# Patient Record
Sex: Female | Born: 1985 | Hispanic: No | Marital: Married | Smoking: Never smoker
Health system: Southern US, Community
[De-identification: ages and names within clinical notes are randomized; demographics above are authoritative.]

## PROBLEM LIST (undated history)

## (undated) DIAGNOSIS — E739 Lactose intolerance, unspecified: Secondary | ICD-10-CM

## (undated) DIAGNOSIS — Q5210 Doubling of vagina, unspecified: Secondary | ICD-10-CM

## (undated) DIAGNOSIS — D649 Anemia, unspecified: Secondary | ICD-10-CM

## (undated) DIAGNOSIS — Z8639 Personal history of other endocrine, nutritional and metabolic disease: Secondary | ICD-10-CM

## (undated) DIAGNOSIS — N63 Unspecified lump in unspecified breast: Secondary | ICD-10-CM

## (undated) HISTORY — DX: Unspecified lump in unspecified breast: N63.0

## (undated) HISTORY — PX: WISDOM TOOTH EXTRACTION: SHX21

## (undated) HISTORY — PX: BREAST BIOPSY: SHX20

## (undated) HISTORY — DX: Personal history of other endocrine, nutritional and metabolic disease: Z86.39

## (undated) HISTORY — DX: Anemia, unspecified: D64.9

## (undated) HISTORY — DX: Lactose intolerance, unspecified: E73.9

## (undated) HISTORY — DX: Doubling of vagina, unspecified: Q52.10

---

## 2012-10-09 ENCOUNTER — Ambulatory Visit (INDEPENDENT_AMBULATORY_CARE_PROVIDER_SITE_OTHER): Payer: BC Managed Care – PPO | Admitting: Family Medicine

## 2012-10-09 ENCOUNTER — Encounter: Payer: Self-pay | Admitting: Family Medicine

## 2012-10-09 VITALS — BP 102/66 | HR 53 | Resp 16 | Ht 64.5 in | Wt 131.0 lb

## 2012-10-09 DIAGNOSIS — N390 Urinary tract infection, site not specified: Secondary | ICD-10-CM

## 2012-10-09 DIAGNOSIS — Z01419 Encounter for gynecological examination (general) (routine) without abnormal findings: Secondary | ICD-10-CM

## 2012-10-09 DIAGNOSIS — Z124 Encounter for screening for malignant neoplasm of cervix: Secondary | ICD-10-CM

## 2012-10-09 LAB — POCT URINALYSIS DIPSTICK
Bilirubin, UA: NEGATIVE
Ketones, UA: NEGATIVE
Protein, UA: NEGATIVE
Spec Grav, UA: 1.015

## 2012-10-09 NOTE — Patient Instructions (Addendum)
Urinary Tract Infection Urinary tract infections (UTIs) can develop anywhere along your urinary tract. Your urinary tract is your body's drainage system for removing wastes and extra water. Your urinary tract includes two kidneys, two ureters, a bladder, and a urethra. Your kidneys are a pair of bean-shaped organs. Each kidney is about the size of your fist. They are located below your ribs, one on each side of your spine. CAUSES Infections are caused by microbes, which are microscopic organisms, including fungi, viruses, and bacteria. These organisms are so small that they can only be seen through a microscope. Bacteria are the microbes that most commonly cause UTIs. SYMPTOMS  Symptoms of UTIs may vary by age and gender of the patient and by the location of the infection. Symptoms in young women typically include a frequent and intense urge to urinate and a painful, burning feeling in the bladder or urethra during urination. Older women and men are more likely to be tired, shaky, and weak and have muscle aches and abdominal pain. A fever may mean the infection is in your kidneys. Other symptoms of a kidney infection include pain in your back or sides below the ribs, nausea, and vomiting. DIAGNOSIS To diagnose a UTI, your caregiver will ask you about your symptoms. Your caregiver also will ask to provide a urine sample. The urine sample will be tested for bacteria and white blood cells. White blood cells are made by your body to help fight infection. TREATMENT  Typically, UTIs can be treated with medication. Because most UTIs are caused by a bacterial infection, they usually can be treated with the use of antibiotics. The choice of antibiotic and length of treatment depend on your symptoms and the type of bacteria causing your infection. HOME CARE INSTRUCTIONS  If you were prescribed antibiotics, take them exactly as your caregiver instructs you. Finish the medication even if you feel better after you  have only taken some of the medication.  Drink enough water and fluids to keep your urine clear or pale yellow.  Avoid caffeine, tea, and carbonated beverages. They tend to irritate your bladder.  Empty your bladder often. Avoid holding urine for long periods of time.  Empty your bladder before and after sexual intercourse.  After a bowel movement, women should cleanse from front to back. Use each tissue only once. SEEK MEDICAL CARE IF:   You have back pain.  You develop a fever.  Your symptoms do not begin to resolve within 3 days. SEEK IMMEDIATE MEDICAL CARE IF:   You have severe back pain or lower abdominal pain.  You develop chills.  You have nausea or vomiting.  You have continued burning or discomfort with urination. MAKE SURE YOU:   Understand these instructions.  Will watch your condition.  Will get help right away if you are not doing well or get worse. Document Released: 11/23/2004 Document Revised: 08/15/2011 Document Reviewed: 03/24/2011 Exit[employer] Patient Information 2014 Exit[employer], [place].  ````1`reventive Care for Adults, Female A healthy lifestyle and preventive care can promote health and wellness. Preventive health guidelines for women include the following key practices.  A routine yearly physical is a good way to check with your caregiver about your health and preventive screening. It is a chance to share any concerns and updates on your health, and to receive a thorough exam.  Visit your dentist for a routine exam and preventive care every 6 months. Brush your teeth twice a day and floss once a day. Good oral hygiene prevents tooth  decay and gum disease.  The frequency of eye exams is based on your age, health, family medical history, use of contact lenses, and other factors. Follow your caregiver's recommendations for frequency of eye exams.  Eat a healthy diet. Foods like vegetables, fruits, whole grains, low-fat dairy products, and lean protein foods  contain the nutrients you need without too many calories. Decrease your intake of foods high in solid fats, added sugars, and salt. Eat the right amount of calories for you.Get information about a proper diet from your caregiver, if necessary.  Regular physical exercise is one of the most important things you can do for your health. Most adults should get at least 150 minutes of moderate-intensity exercise (any activity that increases your heart rate and causes you to sweat) each week. In addition, most adults need muscle-strengthening exercises on 2 or more days a week.  Maintain a healthy weight. The body mass index (BMI) is a screening tool to identify possible weight problems. It provides an estimate of body fat based on height and weight. Your caregiver can help determine your BMI, and can help you achieve or maintain a healthy weight.For adults 20 years and older:  A BMI below 18.5 is considered underweight.  A BMI of 18.5 to 24.9 is normal.  A BMI of 25 to 29.9 is considered overweight.  A BMI of 30 and above is considered obese.  Maintain normal blood lipids and cholesterol levels by exercising and minimizing your intake of saturated fat. Eat a balanced diet with plenty of fruit and vegetables. Blood tests for lipids and cholesterol should begin at age 69 and be repeated every 5 years. If your lipid or cholesterol levels are high, you are over 50, or you are at high risk for heart disease, you may need your cholesterol levels checked more frequently.Ongoing high lipid and cholesterol levels should be treated with medicines if diet and exercise are not effective.  If you smoke, find out from your caregiver how to quit. If you do not use tobacco, do not start.  If you are pregnant, do not drink alcohol. If you are breastfeeding, be very cautious about drinking alcohol. If you are not pregnant and choose to drink alcohol, do not exceed 1 drink per day. One drink is considered to be 12  ounces (355 mL) of beer, 5 ounces (148 mL) of wine, or 1.5 ounces (44 mL) of liquor.  Avoid use of street drugs. Do not share needles with anyone. Ask for help if you need support or instructions about stopping the use of drugs.  High blood pressure causes heart disease and increases the risk of stroke. Your blood pressure should be checked at least every 1 to 2 years. Ongoing high blood pressure should be treated with medicines if weight loss and exercise are not effective.  If you are 74 to 26 years old, ask your caregiver if you should take aspirin to prevent strokes.  Diabetes screening involves taking a blood sample to check your fasting blood sugar level. This should be done once every 3 years, after age 43, if you are within normal weight and without risk factors for diabetes. Testing should be considered at a younger age or be carried out more frequently if you are overweight and have at least 1 risk factor for diabetes.  Breast cancer screening is essential preventive care for women. You should practice "breast self-awareness." This means understanding the normal appearance and feel of your breasts and may include breast self-examination.  Any changes detected, no matter how small, should be reported to a caregiver. Women in their 53s and 30s should have a clinical breast exam (CBE) by a caregiver as part of a regular health exam every 1 to 3 years. After age 65, women should have a CBE every year. Starting at age 52, women should consider having a mammography (breast X-ray test) every year. Women who have a family history of breast cancer should talk to their caregiver about genetic screening. Women at a high risk of breast cancer should talk to their caregivers about having magnetic resonance imaging (MRI) and a mammography every year.  The Pap test is a screening test for cervical cancer. A Pap test can show cell changes on the cervix that might become cervical cancer if left untreated. A Pap  test is a procedure in which cells are obtained and examined from the lower end of the uterus (cervix).  Women should have a Pap test starting at age 70.  Between ages 65 and 32, Pap tests should be repeated every 2 years.  Beginning at age 30, you should have a Pap test every 3 years as long as the past 3 Pap tests have been normal.  Some women have medical problems that increase the chance of getting cervical cancer. Talk to your caregiver about these problems. It is especially important to talk to your caregiver if a new problem develops soon after your last Pap test. In these cases, your caregiver may recommend more frequent screening and Pap tests.  The above recommendations are the same for women who have or have not gotten the vaccine for human papillomavirus (HPV).  If you had a hysterectomy for a problem that was not cancer or a condition that could lead to cancer, then you no longer need Pap tests. Even if you no longer need a Pap test, a regular exam is a good idea to make sure no other problems are starting.  If you are between ages 9 and 8, and you have had normal Pap tests going back 10 years, you no longer need Pap tests. Even if you no longer need a Pap test, a regular exam is a good idea to make sure no other problems are starting.  If you have had past treatment for cervical cancer or a condition that could lead to cancer, you need Pap tests and screening for cancer for at least 20 years after your treatment.  If Pap tests have been discontinued, risk factors (such as a new sexual partner) need to be reassessed to determine if screening should be resumed.  The HPV test is an additional test that may be used for cervical cancer screening. The HPV test looks for the virus that can cause the cell changes on the cervix. The cells collected during the Pap test can be tested for HPV. The HPV test could be used to screen women aged 49 years and older, and should be used in women of  any age who have unclear Pap test results. After the age of 63, women should have HPV testing at the same frequency as a Pap test.  Colorectal cancer can be detected and often prevented. Most routine colorectal cancer screening begins at the age of 47 and continues through age 75. However, your caregiver may recommend screening at an earlier age if you have risk factors for colon cancer. On a yearly basis, your caregiver may provide home test kits to check for hidden blood in the stool. Use  of a small camera at the end of a tube, to directly examine the colon (sigmoidoscopy or colonoscopy), can detect the earliest forms of colorectal cancer. Talk to your caregiver about this at age 3, when routine screening begins. Direct examination of the colon should be repeated every 5 to 10 years through age 35, unless early forms of pre-cancerous polyps or small growths are found.  Hepatitis C blood testing is recommended for all people born from [birth year] through 1965 and any individual with known risks for hepatitis C.  Practice safe sex. Use condoms and avoid high-risk sexual practices to reduce the spread of sexually transmitted infections (STIs). STIs include gonorrhea, chlamydia, syphilis, trichomonas, herpes, HPV, and human immunodeficiency virus (HIV). Herpes, HIV, and HPV are viral illnesses that have no cure. They can result in disability, cancer, and death. Sexually active women aged 1 and younger should be checked for chlamydia. Older women with new or multiple partners should also be tested for chlamydia. Testing for other STIs is recommended if you are sexually active and at increased risk.  Osteoporosis is a disease in which the bones lose minerals and strength with aging. This can result in serious bone fractures. The risk of osteoporosis can be identified using a bone density scan. Women ages 100 and over and women at risk for fractures or osteoporosis should discuss screening with their caregivers. Ask  your caregiver whether you should take a calcium supplement or vitamin D to reduce the rate of osteoporosis.  Menopause can be associated with physical symptoms and risks. Hormone replacement therapy is available to decrease symptoms and risks. You should talk to your caregiver about whether hormone replacement therapy is right for you.  Use sunscreen with sun protection factor (SPF) of 30 or more. Apply sunscreen liberally and repeatedly throughout the day. You should seek shade when your shadow is shorter than you. Protect yourself by wearing long sleeves, pants, a wide-brimmed hat, and sunglasses year round, whenever you are outdoors.  Once a month, do a whole body skin exam, using a mirror to look at the skin on your back. Notify your caregiver of new moles, moles that have irregular borders, moles that are larger than a pencil eraser, or moles that have changed in shape or color.  Stay current with required immunizations.  Influenza. You need a dose every fall (or winter). The composition of the flu vaccine changes each year, so being vaccinated once is not enough.  Pneumococcal polysaccharide. You need 1 to 2 doses if you smoke cigarettes or if you have certain chronic medical conditions. You need 1 dose at age 34 (or older) if you have never been vaccinated.  Tetanus, diphtheria, pertussis (Tdap, Td). Get 1 dose of Tdap vaccine if you are younger than age 51, are over 60 and have contact with an infant, are a Research scientist (physical sciences), are pregnant, or simply want to be protected from whooping cough. After that, you need a Td booster dose every 10 years. Consult your caregiver if you have not had at least 3 tetanus and diphtheria-containing shots sometime in your life or have a deep or dirty wound.  HPV. You need this vaccine if you are a woman age 48 or younger. The vaccine is given in 3 doses over 6 months.  Measles, mumps, rubella (MMR). You need at least 1 dose of MMR if you were born in 1957 or  later. You may also need a second dose.  Meningococcal. If you are age 27 to 31 and  a Orthoptist living in a residence hall, or have one of several medical conditions, you need to get vaccinated against meningococcal disease. You may also need additional booster doses.  Zoster (shingles). If you are age 67 or older, you should get this vaccine.  Varicella (chickenpox). If you have never had chickenpox or you were vaccinated but received only 1 dose, talk to your caregiver to find out if you need this vaccine.  Hepatitis A. You need this vaccine if you have a specific risk factor for hepatitis A virus infection or you simply wish to be protected from this disease. The vaccine is usually given as 2 doses, 6 to 18 months apart.  Hepatitis B. You need this vaccine if you have a specific risk factor for hepatitis B virus infection or you simply wish to be protected from this disease. The vaccine is given in 3 doses, usually over 6 months. Preventive Services / Frequency Ages 79 to 29  Blood pressure check.** / Every 1 to 2 years.  Lipid and cholesterol check.** / Every 5 years beginning at age 38.  Clinical breast exam.** / Every 3 years for women in their 24s and 30s.  Pap test.** / Every 2 years from ages 26 through 73. Every 3 years starting at age 2 through age 55 or 68 with a history of 3 consecutive normal Pap tests.  HPV screening.** / Every 3 years from ages 31 through ages 85 to 62 with a history of 3 consecutive normal Pap tests.  Hepatitis C blood test.** / For any individual with known risks for hepatitis C.  Skin self-exam. / Monthly.  Influenza immunization.** / Every year.  Pneumococcal polysaccharide immunization.** / 1 to 2 doses if you smoke cigarettes or if you have certain chronic medical conditions.  Tetanus, diphtheria, pertussis (Tdap, Td) immunization. / A one-time dose of Tdap vaccine. After that, you need a Td booster dose every 10 years.  HPV  immunization. / 3 doses over 6 months, if you are 42 and younger.  Measles, mumps, rubella (MMR) immunization. / You need at least 1 dose of MMR if you were born in 1957 or later. You may also need a second dose.  Meningococcal immunization. / 1 dose if you are age 28 to 67 and a first-year college student living in a residence hall, or have one of several medical conditions, you need to get vaccinated against meningococcal disease. You may also need additional booster doses.  Varicella immunization.** / Consult your caregiver.  Hepatitis A immunization.** / Consult your caregiver. 2 doses, 6 to 18 months apart.  Hepatitis B immunization.** / Consult your caregiver. 3 doses usually over 6 months. Ages 69 to 83  Blood pressure check.** / Every 1 to 2 years.  Lipid and cholesterol check.** / Every 5 years beginning at age 73.  Clinical breast exam.** / Every year after age 16.  Mammogram.** / Every year beginning at age 27 and continuing for as long as you are in good health. Consult with your caregiver.  Pap test.** / Every 3 years starting at age 63 through age 84 or 86 with a history of 3 consecutive normal Pap tests.  HPV screening.** / Every 3 years from ages 48 through ages 16 to 41 with a history of 3 consecutive normal Pap tests.  Fecal occult blood test (FOBT) of stool. / Every year beginning at age 74 and continuing until age 31. You may not need to do this test if you  get a colonoscopy every 10 years.  Flexible sigmoidoscopy or colonoscopy.** / Every 5 years for a flexible sigmoidoscopy or every 10 years for a colonoscopy beginning at age 62 and continuing until age 73.  Hepatitis C blood test.** / For all people born from [birth year] through 1964 and any individual with known risks for hepatitis C.  Skin self-exam. / Monthly.  Influenza immunization.** / Every year.  Pneumococcal polysaccharide immunization.** / 1 to 2 doses if you smoke cigarettes or if you have certain chronic  medical conditions.  Tetanus, diphtheria, pertussis (Tdap, Td) immunization.** / A one-time dose of Tdap vaccine. After that, you need a Td booster dose every 10 years.  Measles, mumps, rubella (MMR) immunization. / You need at least 1 dose of MMR if you were born in 1957 or later. You may also need a second dose.  Varicella immunization.** / Consult your caregiver.  Meningococcal immunization.** / Consult your caregiver.  Hepatitis A immunization.** / Consult your caregiver. 2 doses, 6 to 18 months apart.  Hepatitis B immunization.** / Consult your caregiver. 3 doses, usually over 6 months. Ages 4 and over  Blood pressure check.** / Every 1 to 2 years.  Lipid and cholesterol check.** / Every 5 years beginning at age 31.  Clinical breast exam.** / Every year after age 72.  Mammogram.** / Every year beginning at age 42 and continuing for as long as you are in good health. Consult with your caregiver.  Pap test.** / Every 3 years starting at age 54 through age 70 or 62 with a 3 consecutive normal Pap tests. Testing can be stopped between 65 and 70 with 3 consecutive normal Pap tests and no abnormal Pap or HPV tests in the past 10 years.  HPV screening.** / Every 3 years from ages 46 through ages 77 or 62 with a history of 3 consecutive normal Pap tests. Testing can be stopped between 65 and 70 with 3 consecutive normal Pap tests and no abnormal Pap or HPV tests in the past 10 years.  Fecal occult blood test (FOBT) of stool. / Every year beginning at age 58 and continuing until age 61. You may not need to do this test if you get a colonoscopy every 10 years.  Flexible sigmoidoscopy or colonoscopy.** / Every 5 years for a flexible sigmoidoscopy or every 10 years for a colonoscopy beginning at age 72 and continuing until age 30.  Hepatitis C blood test.** / For all people born from [birth year] through 1964 and any individual with known risks for hepatitis C.  Osteoporosis screening.** / A  one-time screening for women ages 45 and over and women at risk for fractures or osteoporosis.  Skin self-exam. / Monthly.  Influenza immunization.** / Every year.  Pneumococcal polysaccharide immunization.** / 1 dose at age 66 (or older) if you have never been vaccinated.  Tetanus, diphtheria, pertussis (Tdap, Td) immunization. / A one-time dose of Tdap vaccine if you are over 65 and have contact with an infant, are a Research scientist (physical sciences), or simply want to be protected from whooping cough. After that, you need a Td booster dose every 10 years.  Varicella immunization.** / Consult your caregiver.  Meningococcal immunization.** / Consult your caregiver.  Hepatitis A immunization.** / Consult your caregiver. 2 doses, 6 to 18 months apart.  Hepatitis B immunization.** / Check with your caregiver. 3 doses, usually over 6 months. ** Family history and personal history of risk and conditions may change your caregiver's recommendations. Document Released: 04/11/2001 Document  Revised: 05/08/2011 Document Reviewed: 07/11/2010 Exit[employer] Patient Information 2014 [place], [place].

## 2012-10-09 NOTE — Progress Notes (Signed)
  Subjective:     Joann Harper is a 27 y.o. female and is here for a comprehensive physical exam. The patient reports no problems. Has h/o vaginal septum, and 2 cervices.  Has not had renal system imaging, but knows she needs it. Feels like she has a urinary tract infection, because she has anorexia and abdominal pain.  Denies dysuria.  History   Social History  . Marital Status: Married    Spouse Name: N/A    Number of Children: N/A  . Years of Education: N/A   Occupational History  . student    Social History Main Topics  . Smoking status: Never Smoker   . Smokeless tobacco: Never Used  . Alcohol Use: Yes     Comment: occasionallly  . Drug Use: No  . Sexual Activity: Yes    Partners: Male   Other Topics Concern  . Not on file   Social History Narrative  . No narrative on file   No health maintenance topics applied.  The following portions of the patient's history were reviewed and updated as appropriate: allergies, current medications, past family history, past medical history, past social history, past surgical history and problem list.  Review of Systems A comprehensive review of systems was negative.   Objective:    BP 102/66  Pulse 53  Resp 16  Ht 5' 4.5" (1.638 m)  Wt 131 lb (59.421 kg)  BMI 22.15 kg/m2  LMP 09/18/2012 General appearance: alert, cooperative and appears stated age Head: Normocephalic, without obvious abnormality, atraumatic Neck: no adenopathy, supple, symmetrical, trachea midline and thyroid not enlarged, symmetric, no tenderness/mass/nodules Lungs: clear to auscultation bilaterally Breasts: normal appearance, no masses or tenderness Heart: regular rate and rhythm, S1, S2 normal, no murmur, click, rub or gallop Abdomen: soft, non-tender; bowel sounds normal; no masses,  no organomegaly Pelvic: external genitalia normal, no adnexal masses or tenderness, uterus normal size, shape, and consistency and patient has partial vaginal septum, 2  cervices noted both appear normal, 2 uteri are also probably present, but it is difficult to distinguish on bimanual exam. Extremities: extremities normal, atraumatic, no cyanosis or edema Pulses: 2+ and symmetric Skin: Skin color, texture, turgor normal. No rashes or lesions Lymph nodes: Cervical, supraclavicular, and axillary nodes normal. Neurologic: Grossly normal    U/A is essentially negative--tr blood.   Assessment:    Healthy female exam. Uterine Didelphus      Plan:  Pap smear x 2 obtained.   Will check culture. See After Visit Summary for Counseling Recommendations

## 2012-10-10 ENCOUNTER — Telehealth: Payer: Self-pay | Admitting: *Deleted

## 2012-10-10 LAB — COMPREHENSIVE METABOLIC PANEL
Albumin: 4.2 g/dL (ref 3.5–5.2)
BUN: 10 mg/dL (ref 6–23)
CO2: 26 mEq/L (ref 19–32)
Calcium: 8.6 mg/dL (ref 8.4–10.5)
Chloride: 103 mEq/L (ref 96–112)
Creat: 0.63 mg/dL (ref 0.50–1.10)
Glucose, Bld: 43 mg/dL — CL (ref 70–99)
Potassium: 4.2 mEq/L (ref 3.5–5.3)

## 2012-10-10 LAB — LIPID PANEL
Cholesterol: 88 mg/dL (ref 0–200)
HDL: 40 mg/dL (ref >39)
Triglycerides: 41 mg/dL (ref <150)

## 2012-10-10 LAB — CBC
HCT: 38.5 % (ref 36.0–46.0)
Hemoglobin: 13.1 g/dL (ref 12.0–15.0)
MCV: 88.3 fL (ref 78.0–100.0)
RBC: 4.36 MIL/uL (ref 3.87–5.11)
RDW: 13.4 % (ref 11.5–15.5)
WBC: 4.8 10*3/uL (ref 4.0–10.5)

## 2012-10-10 LAB — TSH: TSH: 1.493 u[IU]/mL (ref 0.350–4.500)

## 2012-10-10 NOTE — Telephone Encounter (Signed)
Copy of labs mailed to pt's home address. 

## 2012-10-10 NOTE — Progress Notes (Signed)
Copy of labs mailed to pt's home address. 

## 2012-10-14 LAB — CULTURE, URINE COMPREHENSIVE

## 2012-10-17 ENCOUNTER — Telehealth: Payer: Self-pay | Admitting: *Deleted

## 2012-10-17 DIAGNOSIS — N390 Urinary tract infection, site not specified: Secondary | ICD-10-CM

## 2012-10-17 MED ORDER — AMPICILLIN 500 MG PO CAPS: 500.0000 mg | ORAL_CAPSULE | Freq: Three times a day (TID) | ORAL | Status: DC

## 2012-10-17 NOTE — Telephone Encounter (Signed)
Called pt to adv meds for UTI has been sent to pharm - Pt is at school but will have someone pick meds up for her

## 2013-01-02 ENCOUNTER — Other Ambulatory Visit: Payer: Self-pay

## 2013-10-17 ENCOUNTER — Ambulatory Visit: Payer: BC Managed Care – PPO

## 2013-12-09 ENCOUNTER — Ambulatory Visit (INDEPENDENT_AMBULATORY_CARE_PROVIDER_SITE_OTHER): Payer: BC Managed Care – PPO | Admitting: Obstetrics & Gynecology

## 2013-12-09 ENCOUNTER — Other Ambulatory Visit: Payer: Self-pay | Admitting: Obstetrics & Gynecology

## 2013-12-09 ENCOUNTER — Encounter: Payer: Self-pay | Admitting: Obstetrics & Gynecology

## 2013-12-09 VITALS — BP 117/71 | HR 73 | Resp 16 | Ht 64.5 in | Wt 134.0 lb

## 2013-12-09 DIAGNOSIS — N63 Unspecified lump in breast: Secondary | ICD-10-CM

## 2013-12-09 DIAGNOSIS — Q512 Other doubling of uterus, unspecified: Secondary | ICD-10-CM

## 2013-12-09 DIAGNOSIS — Z01419 Encounter for gynecological examination (general) (routine) without abnormal findings: Secondary | ICD-10-CM | POA: Diagnosis not present

## 2013-12-09 DIAGNOSIS — R102 Pelvic and perineal pain: Secondary | ICD-10-CM

## 2013-12-09 DIAGNOSIS — N6320 Unspecified lump in the left breast, unspecified quadrant: Secondary | ICD-10-CM

## 2013-12-09 DIAGNOSIS — Q5128 Other doubling of uterus, other specified: Secondary | ICD-10-CM

## 2013-12-10 DIAGNOSIS — N6320 Unspecified lump in the left breast, unspecified quadrant: Secondary | ICD-10-CM | POA: Insufficient documentation

## 2013-12-10 LAB — GC/CHLAMYDIA PROBE AMP
CT PROBE, AMP APTIMA: NEGATIVE
GC Probe RNA: NEGATIVE

## 2013-12-10 LAB — WET PREP BY MOLECULAR PROBE
Candida species: POSITIVE — AB
GARDNERELLA VAGINALIS: NEGATIVE
Trichomonas vaginosis: NEGATIVE

## 2013-12-10 MED ORDER — FLUCONAZOLE 150 MG PO TABS: 150.0000 mg | ORAL_TABLET | Freq: Once | ORAL | Status: DC

## 2013-12-10 NOTE — Progress Notes (Signed)
  Subjective:     Weldon InchesLorraine Winnick is a 28 y.o. female here for a routine exam.  Current complaints: diarrhea, urinary frequency adn pain, vaginal discharge, pain with intercourse.  Personal health questionnaire reviewed: yes.   Gynecologic History Pt has uterine didelphyis with 2 cervix (per Dr. Tawni LevyPratt's note) Patient's last menstrual period was 10/11/2013. Contraception: condoms Last Pap: 2014. Results were: normal Last mammogram: n/a.    Obstetric History OB History  Gravida Para Term Preterm AB SAB TAB Ectopic Multiple Living  0 0 0 0 0 0 0 0 0 0          The following portions of the patient's history were reviewed and updated as appropriate: allergies, current medications, past family history, past medical history, past social history, past surgical history and problem list.  Review of Systems Pertinent items are noted in HPI.    Objective:      Filed Vitals:   12/09/13 1425  BP: 117/71  Pulse: 73  Resp: 16  Height: 5' 4.5" (1.638 m)  Weight: 134 lb (60.782 kg)   Vitals:  WNL General appearance: alert, cooperative and no distress Head: Normocephalic, without obvious abnormality, atraumatic Eyes: negative Throat: lips, mucosa, and tongue normal; teeth and gums normal Lungs: clear to auscultation bilaterally Breasts: normal appearance,No nipple retraction or dimpling, No nipple discharge or bleeding; there is a mass behind the left nipple.  Pt had [place] biopsy several years ago in [place] which was benign.  Would recommend further imagine here. Heart: regular rate and rhythm Abdomen: soft, non-tender; bowel sounds normal; no masses,  no organomegaly  Pelvic:  External Genitalia:  Tanner V, no lesion Urethra:  No pp Vagina:  Pink, normal rugae, no blood or discharge, no septum seen Cervix:  No CMT, no lesion (I ONLY SEE AND FEEL ONE CERVIX.  THE OTHER IS APPARENTLY VERY SMALL.  DUE TO PATIENT PAIN WITH EXAM, WILL STOP AND ORDER US) Uterus:  Normal size and contour, non  tender Adnexa:  Normal, no masses, non tender  Extremities: no edema, redness or tenderness in the calves or thighs Skin: no lesions or rash Lymph nodes: Axillary adenopathy: none        Assessment:    Healthy female exam.  Uterine didelphys on pt report (has never had an US or imagine of her kidneys)   Plan:    Education reviewed: self breast exams. Contraception: condoms. Follow up in: 2 weeks. Pelvic US and TV US Need to image urinary system Needs PCP to address chronic diarrhea UA and U culture today.   Encourage more reliable birth control at next visit.

## 2013-12-11 ENCOUNTER — Other Ambulatory Visit: Payer: Self-pay | Admitting: Obstetrics & Gynecology

## 2013-12-11 DIAGNOSIS — Q512 Other doubling of uterus, unspecified: Principal | ICD-10-CM

## 2013-12-11 DIAGNOSIS — Q5128 Other doubling of uterus, other specified: Secondary | ICD-10-CM

## 2013-12-15 ENCOUNTER — Telehealth: Payer: Self-pay | Admitting: *Deleted

## 2013-12-15 DIAGNOSIS — Q5128 Other doubling of uterus, other specified: Secondary | ICD-10-CM

## 2013-12-15 DIAGNOSIS — Q512 Other doubling of uterus, unspecified: Principal | ICD-10-CM

## 2013-12-15 NOTE — Telephone Encounter (Signed)
LMOM to let pt know Imaging in [place] will be calling to schedule MRI - Pelvis and that U/S Pelvis has been canceled.

## 2013-12-15 NOTE — Telephone Encounter (Signed)
Error

## 2013-12-15 NOTE — Addendum Note (Signed)
Addended by: Arne ClevelandHUTCHINSON, MANDY J on: 12/15/2013 01:33 PM   Modules accepted: Orders

## 2013-12-22 ENCOUNTER — Ambulatory Visit (INDEPENDENT_AMBULATORY_CARE_PROVIDER_SITE_OTHER): Payer: BC Managed Care – PPO

## 2013-12-22 DIAGNOSIS — N8329 Other ovarian cysts: Secondary | ICD-10-CM

## 2013-12-22 DIAGNOSIS — Q5128 Other doubling of uterus, other specified: Secondary | ICD-10-CM

## 2013-12-22 DIAGNOSIS — Q512 Other doubling of uterus, unspecified: Principal | ICD-10-CM

## 2013-12-23 ENCOUNTER — Encounter: Payer: Self-pay | Admitting: Obstetrics & Gynecology

## 2013-12-25 ENCOUNTER — Ambulatory Visit (HOSPITAL_COMMUNITY): Payer: BC Managed Care – PPO

## 2014-01-01 ENCOUNTER — Encounter: Payer: Self-pay | Admitting: Obstetrics & Gynecology

## 2014-01-01 ENCOUNTER — Encounter: Payer: Self-pay | Admitting: *Deleted

## 2014-01-01 ENCOUNTER — Ambulatory Visit (INDEPENDENT_AMBULATORY_CARE_PROVIDER_SITE_OTHER): Payer: BC Managed Care – PPO | Admitting: Obstetrics & Gynecology

## 2014-01-01 VITALS — BP 108/56 | HR 72 | Resp 16 | Ht 64.5 in | Wt 134.0 lb

## 2014-01-01 DIAGNOSIS — B373 Candidiasis of vulva and vagina: Secondary | ICD-10-CM

## 2014-01-01 DIAGNOSIS — N8320 Unspecified ovarian cysts: Secondary | ICD-10-CM

## 2014-01-01 DIAGNOSIS — B3731 Acute candidiasis of vulva and vagina: Secondary | ICD-10-CM

## 2014-01-01 DIAGNOSIS — Q512 Other doubling of uterus: Secondary | ICD-10-CM | POA: Diagnosis not present

## 2014-01-01 MED ORDER — FLUCONAZOLE 150 MG PO TABS: ORAL_TABLET | ORAL | Status: DC

## 2014-01-01 NOTE — Progress Notes (Signed)
ppatient presents today for follow-up of test results. Patient was noted to have candidate on her wet prep and Diflucan was prescribed today. Patient had MRI of her pelvis which showed  There is a soft tissue septum which extends through the central aspect of the uterus all the way to the level of the cervical os. 2 separate endocervical canals and endometrial canals are noted. However, the fundal portion of the uterus appears united by intervening soft tissue. No focal uterine lesions. No focal endometrial lesions. Bilateral ovaries are normal in size and appearance with multiple small follicles measuring 2.4 x 2.7 x 2.6 cm on the right and 2.7 x 1.3 x 3.0 cm on the left. There is also a small 1.6 x 1.6 x 1.6 cm lesion in the in the anterior aspect of the left hemipelvis, which is low T1 signal intensity, high T2 signal intensity, presumably a small paraovarian cyst.  IMPRESSION: 1. Complete septate uterus. 2. 1.6 cm left paraovarian cyst incidentally noted.  Urinary system was not visualized as ordered.  Explained patient's anatomy and detail injury pictures. The patient seemed to understand. Asian does not wish to change her birth control from condoms. Patient knows that she can call us and have this changed easily. Patient not having any urinary tract symptoms currently. However we did review good vulvar hygiene and wiping front to back after using the bathroom.  We'll call radiology and have them reviewed the films to look at her urinary system.  25 minutes spent with patient face-to-face; there is an 50% was counseling.

## 2014-01-06 ENCOUNTER — Ambulatory Visit
Admission: RE | Admit: 2014-01-06 | Discharge: 2014-01-06 | Disposition: A | Discharge status: Home / Self Care | Payer: BC Managed Care – PPO | Source: Ambulatory Visit | Attending: Obstetrics & Gynecology | Admitting: Obstetrics & Gynecology

## 2014-01-06 DIAGNOSIS — N6320 Unspecified lump in the left breast, unspecified quadrant: Secondary | ICD-10-CM

## 2015-11-09 ENCOUNTER — Emergency Department (HOSPITAL_BASED_OUTPATIENT_CLINIC_OR_DEPARTMENT_OTHER): Payer: BLUE CROSS/BLUE SHIELD

## 2015-11-09 ENCOUNTER — Emergency Department (HOSPITAL_BASED_OUTPATIENT_CLINIC_OR_DEPARTMENT_OTHER)
Admission: EM | Admit: 2015-11-09 | Discharge: 2015-11-09 | Discharge status: Against Medical Advice | Payer: BLUE CROSS/BLUE SHIELD | Attending: Emergency Medicine | Admitting: Emergency Medicine

## 2015-11-09 ENCOUNTER — Encounter (HOSPITAL_BASED_OUTPATIENT_CLINIC_OR_DEPARTMENT_OTHER): Payer: Self-pay

## 2015-11-09 DIAGNOSIS — B9789 Other viral agents as the cause of diseases classified elsewhere: Secondary | ICD-10-CM

## 2015-11-09 DIAGNOSIS — E876 Hypokalemia: Secondary | ICD-10-CM | POA: Diagnosis not present

## 2015-11-09 DIAGNOSIS — Z792 Long term (current) use of antibiotics: Secondary | ICD-10-CM | POA: Diagnosis not present

## 2015-11-09 DIAGNOSIS — Z7951 Long term (current) use of inhaled steroids: Secondary | ICD-10-CM | POA: Insufficient documentation

## 2015-11-09 DIAGNOSIS — R0602 Shortness of breath: Secondary | ICD-10-CM | POA: Diagnosis present

## 2015-11-09 DIAGNOSIS — J069 Acute upper respiratory infection, unspecified: Secondary | ICD-10-CM | POA: Insufficient documentation

## 2015-11-09 DIAGNOSIS — J988 Other specified respiratory disorders: Secondary | ICD-10-CM

## 2015-11-09 LAB — BASIC METABOLIC PANEL
Anion gap: 8 (ref 5–15)
BUN: <5 mg/dL — ABNORMAL LOW (ref 6–20)
CHLORIDE: 101 mmol/L (ref 101–111)
CO2: 28 mmol/L (ref 22–32)
Calcium: 8.9 mg/dL (ref 8.9–10.3)
Creatinine, Ser: 0.62 mg/dL (ref 0.44–1.00)
GFR calc non Af Amer: >60 mL/min (ref >60)
Glucose, Bld: 120 mg/dL — ABNORMAL HIGH (ref 65–99)
POTASSIUM: 2.3 mmol/L — AB (ref 3.5–5.1)
SODIUM: 137 mmol/L (ref 135–145)

## 2015-11-09 LAB — URINALYSIS, ROUTINE W REFLEX MICROSCOPIC
Bilirubin Urine: NEGATIVE
Glucose, UA: NEGATIVE mg/dL
Hgb urine dipstick: NEGATIVE
Ketones, ur: NEGATIVE mg/dL
NITRITE: NEGATIVE
PH: 6 (ref 5.0–8.0)
Protein, ur: NEGATIVE mg/dL
SPECIFIC GRAVITY, URINE: 1.028 (ref 1.005–1.030)

## 2015-11-09 LAB — CBC WITH DIFFERENTIAL/PLATELET
BASOS ABS: 0 10*3/uL (ref 0.0–0.1)
Basophils Relative: 0 %
EOS PCT: 2 %
Eosinophils Absolute: 0.1 10*3/uL (ref 0.0–0.7)
HEMATOCRIT: 39.6 % (ref 36.0–46.0)
HEMOGLOBIN: 13.5 g/dL (ref 12.0–15.0)
LYMPHS ABS: 2.8 10*3/uL (ref 0.7–4.0)
LYMPHS PCT: 43 %
MCH: 30.1 pg (ref 26.0–34.0)
MCHC: 34.1 g/dL (ref 30.0–36.0)
MCV: 88.4 fL (ref 78.0–100.0)
MONO ABS: 0.7 10*3/uL (ref 0.1–1.0)
Monocytes Relative: 10 %
NEUTROS ABS: 3 10*3/uL (ref 1.7–7.7)
Neutrophils Relative %: 45 %
Platelets: 456 10*3/uL — ABNORMAL HIGH (ref 150–400)
RBC: 4.48 MIL/uL (ref 3.87–5.11)
RDW: 11.7 % (ref 11.5–15.5)
WBC: 6.6 10*3/uL (ref 4.0–10.5)

## 2015-11-09 LAB — PREGNANCY, URINE: PREG TEST UR: NEGATIVE

## 2015-11-09 LAB — URINE MICROSCOPIC-ADD ON

## 2015-11-09 LAB — PATHOLOGIST SMEAR REVIEW

## 2015-11-09 IMAGING — CR DG CHEST 2V
2 series · 2 of 2 positions shown · non-contrast
Comparison: None.

CLINICAL DATA: Cough and dyspnea

EXAM:
CHEST  2 VIEW

[w chest pa]
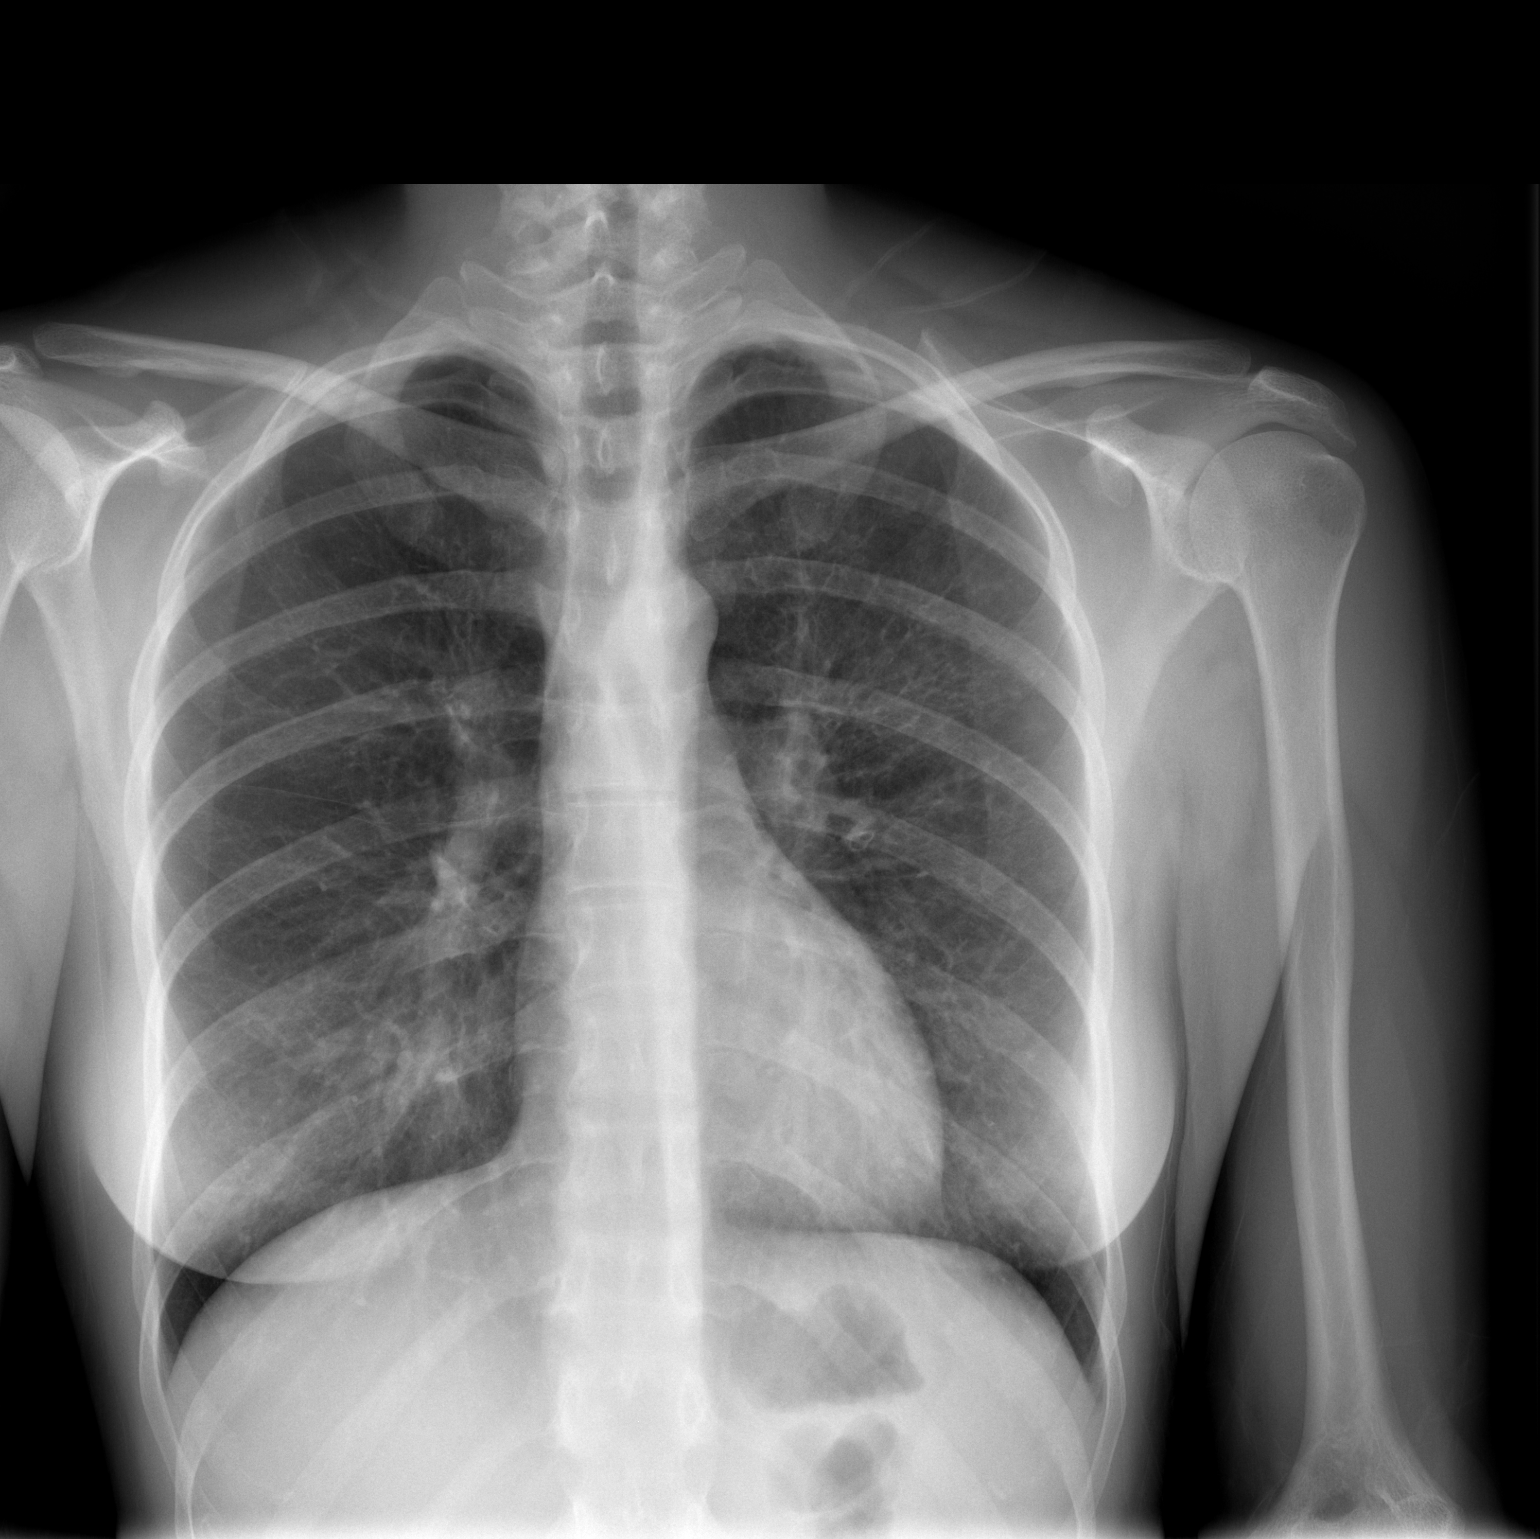

[w chest lat]
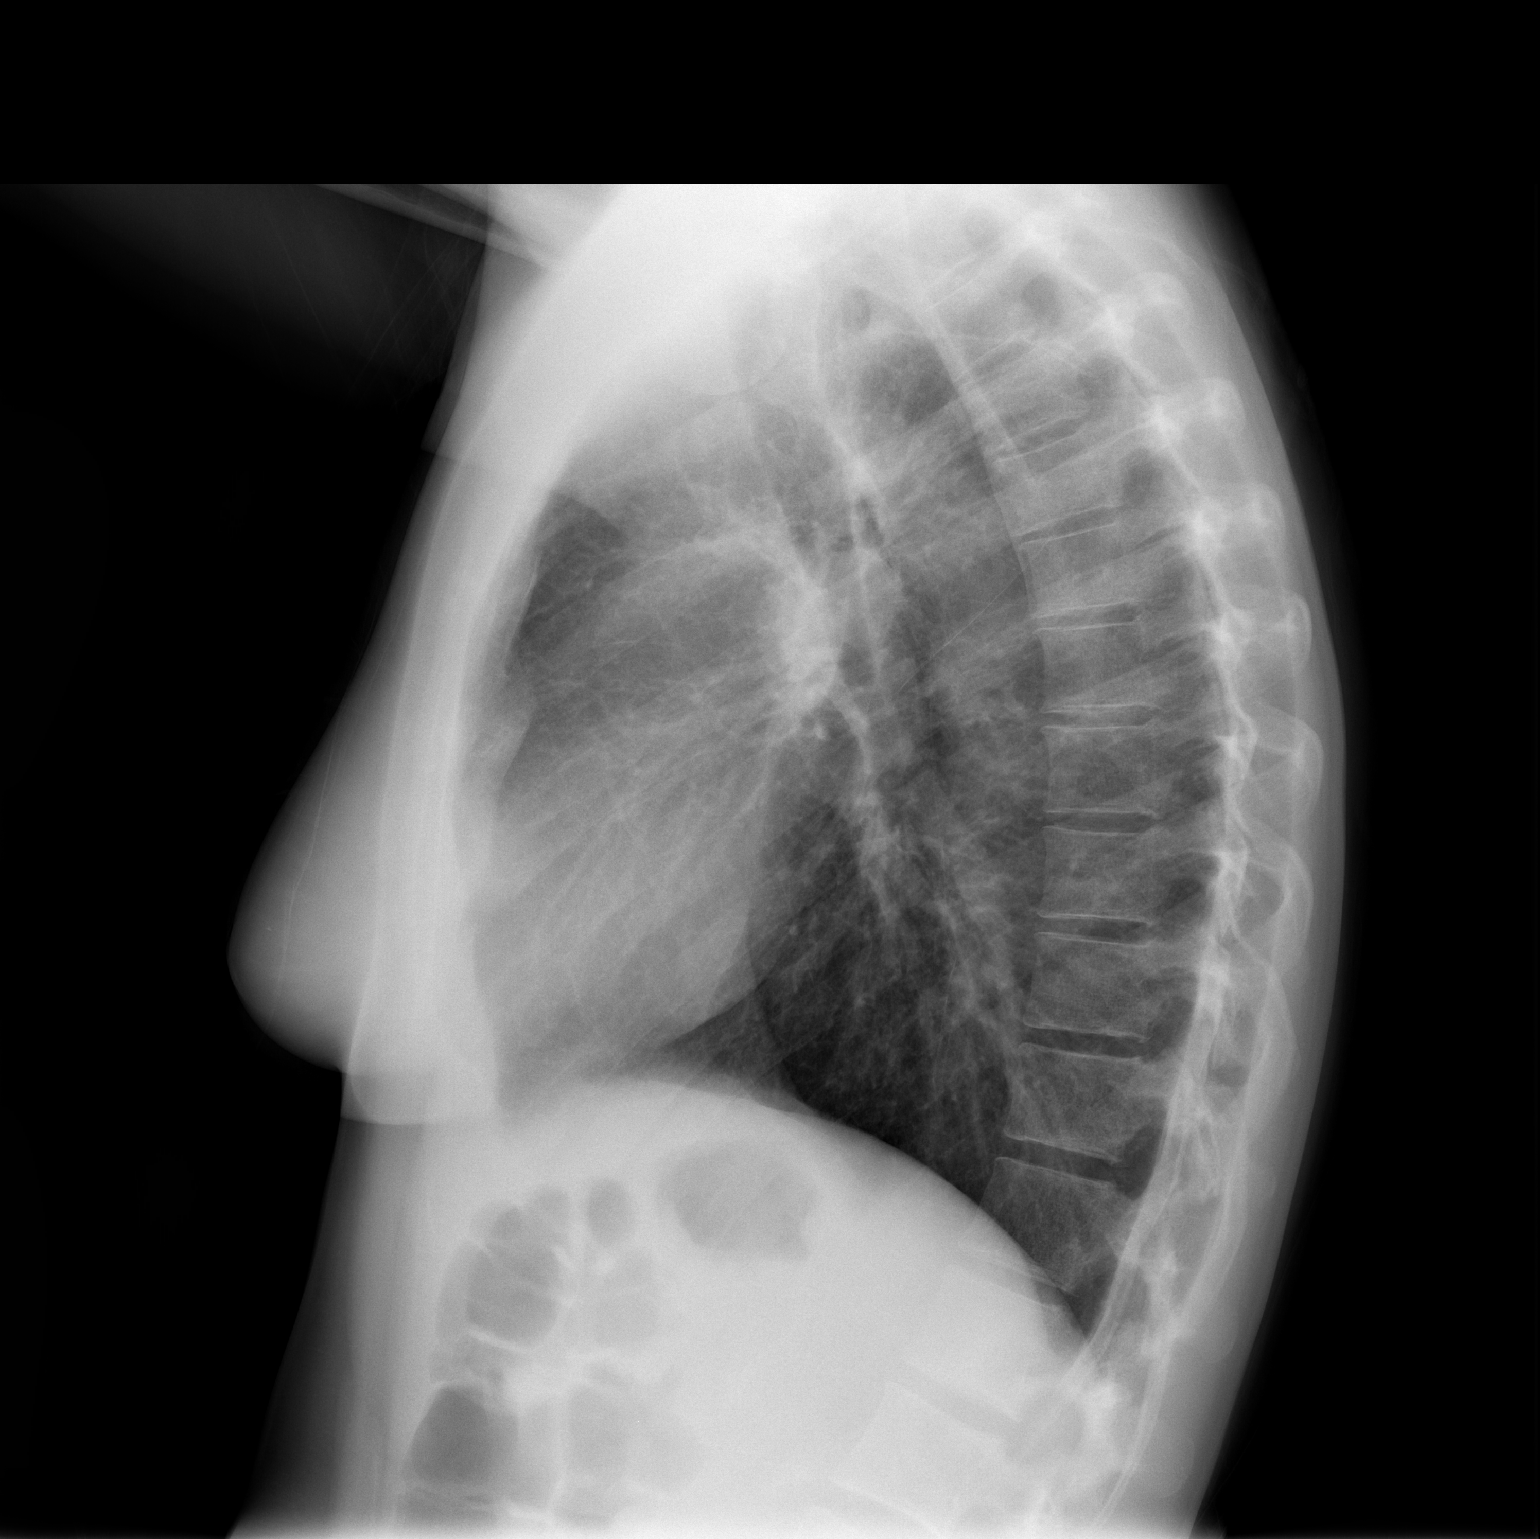

[2 of 2 positions shown; findings below may reference images not displayed]

FINDINGS: The heart size and mediastinal contours are within normal limits.
Both lungs are clear. The visualized skeletal structures are
unremarkable.
IMPRESSION: No active cardiopulmonary disease.

## 2015-11-09 MED ORDER — POTASSIUM CHLORIDE CRYS ER 20 MEQ PO TBCR
40.0000 meq | EXTENDED_RELEASE_TABLET | Freq: Once | ORAL | Status: AC
  Administered 2015-11-09: 40 meq via ORAL
  Filled 2015-11-09: qty 2

## 2015-11-09 MED ORDER — IPRATROPIUM-ALBUTEROL 0.5-2.5 (3) MG/3ML IN SOLN
3.0000 mL | Freq: Four times a day (QID) | RESPIRATORY_TRACT | Status: DC
  Administered 2015-11-09: 3 mL via RESPIRATORY_TRACT
  Filled 2015-11-09: qty 3

## 2015-11-09 MED ORDER — POTASSIUM CHLORIDE 20 MEQ/15ML (10%) PO SOLN: 20.0000 meq | Freq: Four times a day (QID) | ORAL | 0 refills | Status: DC

## 2015-11-09 MED ORDER — POTASSIUM CHLORIDE 10 MEQ/100ML IV SOLN
10.0000 meq | INTRAVENOUS | Status: AC
  Administered 2015-11-09 (×4): 10 meq via INTRAVENOUS
  Filled 2015-11-09 (×4): qty 100

## 2015-11-09 MED FILL — POTASSIUM CL ER 20 MEQ TABL: 20 | 7 days supply | Qty: 28 | Fill #0

## 2015-11-09 NOTE — ED Notes (Signed)
Dr. Read DriversMolpus made aware of pt's critical potassium level

## 2015-11-09 NOTE — ED Provider Notes (Signed)
[facility] Provider Note: Joann Dell, MD, FACEP  CSN: [id number] MRN: [id number] ARRIVAL: 11/09/15 at 0230   CHIEF COMPLAINT  Shortness of Breath   HISTORY OF PRESENT ILLNESS  Joann Harper is a 29 y.o. female who has been sick for about the past 3 weeks. She is somewhat vague about her symptomatology and its timing and is having difficulty answering questions due to being emotionally upset.   Her principal symptoms have been shortness of breath, nonproductive cough, wheezing, fever to 103.7 early in the course but No fever recently, decreased appetite, insomnia, anxiety, hyperventilation, general malaise, diarrhea, vomiting and generalized pain. Most of this was obtained from her husband.  She was seen last week and placed on amoxicillin. She was subsequently given an albuterol inhaler which she states she has been using every 4 hours but it is not helping. She has no previous history of asthma.    Past Medical History:  Diagnosis Date  . Anemia   . Breast lump    left/  biopsy benign  . Lactose intolerance   . Septate vagina    2 cervix    Past Surgical History:  Procedure Laterality Date  . BREAST BIOPSY Left   . WISDOM TOOTH EXTRACTION      Family History  Problem Relation Age of Onset  . Diabetes Mother   . Hypertension Mother   . Depression Mother     bipolar, alcoholic  . Pulmonary fibrosis Father   . Diabetes Father   . Cancer Paternal Grandmother     skin, cataracts  . COPD Paternal Grandfather   . Heart disease Paternal Grandfather   . Kidney disease Maternal Grandfather     dialysis  . Heart disease Maternal Grandfather     pacemaker  . Hypertension Maternal Grandfather   . Diabetes Maternal Grandmother   . Stroke Maternal Grandmother   . Hypertension Maternal Grandmother     Social History  Substance Use Topics  . Smoking status: Never Smoker  . Smokeless tobacco: Never Used  . Alcohol use Yes     Comment: occasionallly      Prior to Admission medications   Medication Sig Start Date End Date Taking? Authorizing Provider  albuterol (PROVENTIL HFA;VENTOLIN HFA) 108 (90 Base) MCG/ACT inhaler Inhale 2 puffs into the lungs every 6 (six) hours as needed for wheezing or shortness of breath.   Yes Historical Provider, MD  AMOXICILLIN PO Take by mouth.   Yes Historical Provider, MD  fluconazole (DIFLUCAN) 150 MG tablet Take one tablet by mouth and repeat in 3 days 01/01/14   Lesly Dukes, MD  Multiple Vitamin (MULTIVITAMIN) tablet Take 1 tablet by mouth daily.    Historical Provider, MD  Probiotic Product (PROBIOTIC DAILY PO) Take by mouth daily.    Historical Provider, MD    Allergies Epinephrine and Lidocaine   REVIEW OF SYSTEMS  Negative except as noted here or in the History of Present Illness.   PHYSICAL EXAMINATION  Initial Vital Signs Blood pressure 155/82, pulse 80, temperature 97.4 F (36.3 C), temperature source Oral, resp. rate 20, height 5\' 5"  (1.651 m), weight 137 lb (62.1 kg), SpO2 100 %.  Examination General: Well-developed, well-nourished female in no acute distress; appearance consistent with age of record HENT: normocephalic; atraumatic; pharynx normal Eyes: pupils equal, round and reactive to light; extraocular muscles intact Neck: supple Heart: regular rate and rhythm Lungs: Mildly decreased air movement bilaterally with shallow breaths; coughing on deep breaths Abdomen: soft; nondistended;  nontender; no masses or hepatosplenomegaly; bowel sounds present Extremities: No deformity; full range of motion; pulses normal Neurologic: Awake, alert and oriented; motor function intact in all extremities and symmetric; no facial droop Skin: Warm and dry Psychiatric: Anxious; tearful; labile affect   RESULTS  Summary of this visit's results, reviewed by myself:   EKG Interpretation  Date/Time:  Tuesday November 09 2015 03:44:16 EDT Ventricular Rate:  73 PR Interval:    QRS  Duration: 111 QT Interval:  462 QTC Calculation: 510 R Axis:   -90 Text Interpretation:  Sinus rhythm Left axis deviation Abnormal T, consider ischemia, anterior leads Prolonged QT interval No previous ECGs available Confirmed by Beyla Loney  MD, Jonny RuizJOHN ([id number]) on 11/09/2015 3:48:09 AM      Laboratory Studies: Results for orders placed or performed during the hospital encounter of 11/09/15 (from the past 24 hour(s))  Urinalysis, Routine w reflex microscopic (not [facility])     Status: Abnormal   Collection Time: 11/09/15  2:57 AM  Result Value Ref Range   Color, Urine YELLOW YELLOW   APPearance CLOUDY (A) CLEAR   Specific Gravity, Urine 1.028 1.005 - 1.030   pH 6.0 5.0 - 8.0   Glucose, UA NEGATIVE NEGATIVE mg/dL   Hgb urine dipstick NEGATIVE NEGATIVE   Bilirubin Urine NEGATIVE NEGATIVE   Ketones, ur NEGATIVE NEGATIVE mg/dL   Protein, ur NEGATIVE NEGATIVE mg/dL   Nitrite NEGATIVE NEGATIVE   Leukocytes, UA TRACE (A) NEGATIVE  Pregnancy, urine     Status: None   Collection Time: 11/09/15  2:57 AM  Result Value Ref Range   Preg Test, Ur NEGATIVE NEGATIVE  Urine microscopic-add on     Status: Abnormal   Collection Time: 11/09/15  2:57 AM  Result Value Ref Range   Squamous Epithelial / LPF 6-30 (A) NONE SEEN   WBC, UA 0-5 0 - 5 WBC/hpf   RBC / HPF 0-5 0 - 5 RBC/hpf   Bacteria, UA FEW (A) NONE SEEN   Urine-Other MUCOUS PRESENT   CBC with Differential/Platelet     Status: Abnormal   Collection Time: 11/09/15  3:02 AM  Result Value Ref Range   WBC 6.6 4.0 - 10.5 K/uL   RBC 4.48 3.87 - 5.11 MIL/uL   Hemoglobin 13.5 12.0 - 15.0 g/dL   HCT 43.339.6 29.536.0 - 18.846.0 %   MCV 88.4 78.0 - 100.0 fL   MCH 30.1 26.0 - 34.0 pg   MCHC 34.1 30.0 - 36.0 g/dL   RDW 41.611.7 60.611.5 - 30.115.5 %   Platelets 456 (H) 150 - 400 K/uL   Neutrophils Relative % 45 %   Neutro Abs 3.0 1.7 - 7.7 K/uL   Lymphocytes Relative 43 %   Lymphs Abs 2.8 0.7 - 4.0 K/uL   Monocytes Relative 10 %   Monocytes Absolute 0.7 0.1 - 1.0 K/uL    Eosinophils Relative 2 %   Eosinophils Absolute 0.1 0.0 - 0.7 K/uL   Basophils Relative 0 %   Basophils Absolute 0.0 0.0 - 0.1 K/uL   WBC Morphology ATYPICAL LYMPHOCYTES    Smear Review PENDING PATHOLOGIST REVIEW   Basic metabolic panel     Status: Abnormal   Collection Time: 11/09/15  3:02 AM  Result Value Ref Range   Sodium 137 135 - 145 mmol/L   Potassium 2.3 (LL) 3.5 - 5.1 mmol/L   Chloride 101 101 - 111 mmol/L   CO2 28 22 - 32 mmol/L   Glucose, Bld 120 (H) 65 - 99 mg/dL  BUN <5 (L) 6 - 20 mg/dL   Creatinine, Ser 0.98 0.44 - 1.00 mg/dL   Calcium 8.9 8.9 - 11.9 mg/dL   GFR calc non Af Amer >60 >60 mL/min   GFR calc Af Amer >60 >60 mL/min   Anion gap 8 5 - 15   Imaging Studies: Dg Chest 2 View  Result Date: 11/09/2015 CLINICAL DATA:  Cough and dyspnea EXAM: CHEST  2 VIEW COMPARISON:  None. FINDINGS: The heart size and mediastinal contours are within normal limits. Both lungs are clear. The visualized skeletal structures are unremarkable. IMPRESSION: No active cardiopulmonary disease. Electronically Signed   By: Ellery Plunk M.D.   On: 11/09/2015 03:22    ED COURSE  Nursing notes and initial vitals signs, including pulse oximetry, reviewed.  3:48 AM Potassium infusion begun for critical hypokalemia.  4:23 AM Potassium infusing. She is no longer tearful and able to answer questions directly. She was advised that admission to the hospital for further repletion of potassium and cardiac monitoring is needed. She was advised that should she leave she must do so Against Medical Advice as she is in danger of dying from sudden cardiac arrest. This was emphasized several times.   4:52 AM The patient insists she cannot afford to be hospitalized and is requesting oral potassium repletion at home. She admits she has been using her albuterol inhaler as often as hourly. She was advised that this may be contributing to her hypokalemia and she needs to use it every 4 hours as instructed.  A second run of potassium chloride is being hung at this time.  6:36 AM Third run of potassium being infused. 40 mEq K-Dur given orally (crushed, as patient cannot swallow pills).  6:43 AM The patient's care was again discussed with her and her husband. They remain adamant against hospital admission. The patient appears capable of making informed decisions at this time. We will administer a fourth 10 milliequivalent run IV potassium for a total of 40 milliequivalents IV in addition to 40 milliequivalents given orally. She has agreed to sign out AGAINST MEDICAL ADVICE. She was advised that she is free to change her mind and return for further treatment at any time without consequence.   PROCEDURES    ED DIAGNOSES     ICD-9-CM ICD-10-CM   1. Acute hypokalemia 276.8 E87.6   2. Viral respiratory illness 079.99 B34.9        Paula Libra, MD 11/09/15 [phone]

## 2015-11-09 NOTE — ED Notes (Signed)
Patient discharged with discharge Rx and work note.  Patient discharged AMA.

## 2015-11-09 NOTE — ED Triage Notes (Signed)
Pt reports being dx with bronchitis x 1 week ago and has been taking amoxicillin and using inhaler. Pt reports shob, no appetite, pain all over.

## 2015-11-09 NOTE — ED Notes (Signed)
Patient transported to X-ray 

## 2015-12-07 ENCOUNTER — Ambulatory Visit: Payer: BLUE CROSS/BLUE SHIELD | Admitting: Obstetrics & Gynecology

## 2015-12-27 ENCOUNTER — Encounter: Payer: Self-pay | Admitting: Obstetrics & Gynecology

## 2015-12-27 ENCOUNTER — Ambulatory Visit (INDEPENDENT_AMBULATORY_CARE_PROVIDER_SITE_OTHER): Payer: BLUE CROSS/BLUE SHIELD | Admitting: Obstetrics & Gynecology

## 2015-12-27 VITALS — BP 106/66 | HR 79 | Ht 65.0 in | Wt 144.0 lb

## 2015-12-27 DIAGNOSIS — Z124 Encounter for screening for malignant neoplasm of cervix: Secondary | ICD-10-CM

## 2015-12-27 DIAGNOSIS — N898 Other specified noninflammatory disorders of vagina: Secondary | ICD-10-CM

## 2015-12-27 DIAGNOSIS — Z01419 Encounter for gynecological examination (general) (routine) without abnormal findings: Secondary | ICD-10-CM | POA: Diagnosis not present

## 2015-12-27 DIAGNOSIS — R300 Dysuria: Secondary | ICD-10-CM

## 2015-12-27 NOTE — Progress Notes (Signed)
Subjective:    Joann Harper is a 30 y.o. MW P0  female who presents for an annual exam. She thinks that she may have a UTI versus yeast infection. She used some OTC yeast meds.  The patient is sexually active. GYN screening history: last pap: was normal. The patient wears seatbelts: yes. The patient participates in regular exercise: yes. Has the patient ever been transfused or tattooed?: no. The patient reports that there is not domestic violence in her life.   Menstrual History: OB History    Gravida Para Term Preterm AB Living   0 0 0 0 0 0   SAB TAB Ectopic Multiple Live Births   0 0 0 0        Menarche age: [age] Patient's last menstrual period was 12/01/2015 (exact date).    The following portions of the patient's history were reviewed and updated as appropriate: allergies, current medications, past family history, past medical history, past social history, past surgical history and problem list.  Review of Systems Pertinent items are noted in HPI.   Married for 7 years, uses condoms for contraception. 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   Objective:    BP 106/66   Pulse 79   Ht 5\' 5"  (1.651 m)   Wt 144 lb (65.3 kg)   LMP 12/01/2015 (Exact Date)   BMI 23.96 kg/m   General Appearance:    Alert, cooperative, no distress, appears stated age  Head:    Normocephalic, without obvious abnormality, atraumatic  Eyes:    PERRL, conjunctiva/corneas clear, EOM's intact, fundi    benign, both eyes  Ears:    Normal TM's and external ear canals, both ears  Nose:   Nares normal, septum midline, mucosa normal, no drainage    or sinus tenderness  Throat:   Lips, mucosa, and tongue normal; teeth and gums normal  Neck:   Supple, symmetrical, trachea midline, no adenopathy;    thyroid:  no enlargement/tenderness/nodules; no carotid   bruit or JVD  Back:     Symmetric, no curvature, ROM normal, no CVA tenderness  Lungs:     Clear to auscultation bilaterally, respirations  unlabored  Chest Wall:    No tenderness or deformity   Heart:    Regular rate and rhythm, S1 and S2 normal, no murmur, rub   or gallop  Breast Exam:    No tenderness, masses, or nipple abnormality  Abdomen:     Soft, non-tender, bowel sounds active all four quadrants,    no masses, no organomegaly  Genitalia:    Normal female without lesion, discharge or tenderness     Extremities:   Extremities normal, atraumatic, no cyanosis or edema  Pulses:   2+ and symmetric all extremities  Skin:   Skin color, texture, turgor normal, no rashes or lesions  Lymph nodes:   Cervical, supraclavicular, and axillary nodes normal  Neurologic:   CNII-XII intact, normal strength, sensation and reflexes    throughout   .    Assessment:    Healthy female exam.   ?UTI   Plan:     Thin prep Pap smear. Urine culture and sensitivity.   Wet prep sent

## 2015-12-28 ENCOUNTER — Telehealth: Payer: Self-pay | Admitting: *Deleted

## 2015-12-28 DIAGNOSIS — B9689 Other specified bacterial agents as the cause of diseases classified elsewhere: Secondary | ICD-10-CM

## 2015-12-28 DIAGNOSIS — N760 Acute vaginitis: Principal | ICD-10-CM

## 2015-12-28 LAB — WET PREP, GENITAL
Trich, Wet Prep: NONE SEEN
WBC WET PREP: NONE SEEN
Yeast Wet Prep HPF POC: NONE SEEN

## 2015-12-28 MED ORDER — METRONIDAZOLE 500 MG PO TABS: 500.0000 mg | ORAL_TABLET | Freq: Two times a day (BID) | ORAL | 0 refills | Status: DC

## 2015-12-28 NOTE — Telephone Encounter (Signed)
Lm on voicemail concerning wet prep results.  [facility] Flag[facility] BID [facility]

## 2015-12-29 ENCOUNTER — Telehealth: Payer: Self-pay | Admitting: *Deleted

## 2015-12-29 DIAGNOSIS — B9689 Other specified bacterial agents as the cause of diseases classified elsewhere: Secondary | ICD-10-CM

## 2015-12-29 DIAGNOSIS — N760 Acute vaginitis: Principal | ICD-10-CM

## 2015-12-29 LAB — URINE CULTURE

## 2015-12-29 LAB — CYTOLOGY - PAP
Adequacy: ABSENT
DIAGNOSIS: NEGATIVE

## 2015-12-29 MED ORDER — METRONIDAZOLE 500 MG PO TABS: 500.0000 mg | ORAL_TABLET | Freq: Two times a day (BID) | ORAL | 0 refills | Status: DC

## 2015-12-29 NOTE — Telephone Encounter (Signed)
Pt called requesting that [facility] Flag[facility] be [facility] [place] instead [place].

## 2017-01-16 ENCOUNTER — Ambulatory Visit (INDEPENDENT_AMBULATORY_CARE_PROVIDER_SITE_OTHER): Payer: BLUE CROSS/BLUE SHIELD | Admitting: Obstetrics and Gynecology

## 2017-01-16 ENCOUNTER — Encounter: Payer: Self-pay | Admitting: Obstetrics and Gynecology

## 2017-01-16 VITALS — BP 100/53 | HR 65 | Resp 16 | Ht 65.0 in | Wt 150.0 lb

## 2017-01-16 DIAGNOSIS — Z01419 Encounter for gynecological examination (general) (routine) without abnormal findings: Secondary | ICD-10-CM

## 2017-01-16 DIAGNOSIS — Z124 Encounter for screening for malignant neoplasm of cervix: Secondary | ICD-10-CM | POA: Diagnosis not present

## 2017-01-16 DIAGNOSIS — Z1151 Encounter for screening for human papillomavirus (HPV): Secondary | ICD-10-CM | POA: Diagnosis not present

## 2017-01-16 NOTE — Progress Notes (Addendum)
Subjective:     Joann Harper is a 31 y.o. female G0 with BMI 24.96 who is here for a comprehensive physical exam. The patient reports intermittent vaginitis for the past 3 months treated with monistat, diflucan and antibiotics. She still reports some vaginal pruritis but denies any vaginal discharge. Patient is sexually active using condoms for contraception. She reports a monthly period lasting 5 days. She denies any pelvic pain or abnormal discharge. She desires to be tested for a UTI as well.  Past Medical History:  Diagnosis Date  . Anemia   . Breast lump    left/  biopsy benign  . History of low potassium   . Lactose intolerance   . Septate vagina    2 cervix   Past Surgical History:  Procedure Laterality Date  . BREAST BIOPSY Left   . WISDOM TOOTH EXTRACTION     Family History  Problem Relation Age of Onset  . Diabetes Mother   . Hypertension Mother   . Depression Mother        bipolar, alcoholic  . Pulmonary fibrosis Father   . Diabetes Father   . Cancer Paternal Grandmother        skin, cataracts  . COPD Paternal Grandfather   . Heart disease Paternal Grandfather   . Kidney disease Maternal Grandfather        dialysis  . Heart disease Maternal Grandfather        pacemaker  . Hypertension Maternal Grandfather   . Diabetes Maternal Grandmother   . Stroke Maternal Grandmother   . Hypertension Maternal Grandmother      Social History   Socioeconomic History  . Marital status: Married    Spouse name: Not on file  . Number of children: Not on file  . Years of education: Not on file  . Highest education level: Not on file  Social Needs  . Financial resource strain: Not on file  . Food insecurity - worry: Not on file  . Food insecurity - inability: Not on file  . Transportation needs - medical: Not on file  . Transportation needs - non-medical: Not on file  Occupational History  . Occupation: Consulting civil engineerstudent  Tobacco Use  . Smoking status: Never Smoker  .  Smokeless tobacco: Never Used  Substance and Sexual Activity  . Alcohol use: Yes    Comment: occasionallly  . Drug use: No  . Sexual activity: Yes    Partners: Male    Birth control/protection: Condom  Other Topics Concern  . Not on file  Social History Narrative  . Not on file   Health Maintenance  Topic Date Due  . HIV Screening  08/23/2001  . TETANUS/TDAP  08/23/2005  . INFLUENZA VACCINE  09/27/2016  . PAP SMEAR  12/27/2018       Review of Systems Pertinent items are noted in HPI.   Objective:  Blood pressure (!) 100/53, pulse 65, resp. rate 16, height 5\' 5"  (1.651 m), weight 150 lb (68 kg), last menstrual period 01/02/2017.     GENERAL: Well-developed, well-nourished female in no acute distress.  HEENT: Normocephalic, atraumatic. Sclerae anicteric.  NECK: Supple. Normal thyroid.  LUNGS: Clear to auscultation bilaterally.  HEART: Regular rate and rhythm. BREASTS: Symmetric in size. Small 1 cm left breast mass, mobile and nontender, located at 9 o'clock position underlying nipple. No palpable lymphadenopathy, skin changes, or nipple drainage. ABDOMEN: Soft, nontender, nondistended. No organomegaly. PELVIC: Normal external female genitalia. Vagina is pink and rugated.  Normal discharge. Normal  appearing cervix. Uterus is normal in size. No adnexal mass or tenderness. EXTREMITIES: No cyanosis, clubbing, or edema, 2+ distal pulses.    Assessment:    Healthy female exam.      Plan:    Pap smear collected and candida and gardenela testing as well Urine culture sent Patient will be contacted with abnormal results Patient plans to try to conceive in May and was advised to start taking prenatal vitamins Patient reports having a previous biopsy for left breast mass which was benign. No follow up required. Patient states that the mass is smaller than what is used to be See After Visit Summary for Counseling Recommendations

## 2017-01-17 LAB — URINE CULTURE
MICRO NUMBER: 81310329
SPECIMEN QUALITY: ADEQUATE

## 2017-01-22 LAB — CYTOLOGY - PAP
Bacterial vaginitis: NEGATIVE
CANDIDA VAGINITIS: NEGATIVE
Diagnosis: NEGATIVE
HPV (WINDOPATH): NOT DETECTED

## 2019-08-26 ENCOUNTER — Encounter: Payer: Self-pay | Admitting: *Deleted
